# Patient Record
Sex: Male | Born: 1935 | Race: White | Hispanic: No | Marital: Single | Smoking: Never smoker
Health system: Southern US, Community
[De-identification: ages and names within clinical notes are randomized; demographics above are authoritative.]

## PROBLEM LIST (undated history)

## (undated) DIAGNOSIS — N400 Enlarged prostate without lower urinary tract symptoms: Secondary | ICD-10-CM

## (undated) DIAGNOSIS — K219 Gastro-esophageal reflux disease without esophagitis: Secondary | ICD-10-CM

## (undated) DIAGNOSIS — E559 Vitamin D deficiency, unspecified: Secondary | ICD-10-CM

## (undated) DIAGNOSIS — R7989 Other specified abnormal findings of blood chemistry: Secondary | ICD-10-CM

## (undated) DIAGNOSIS — K635 Polyp of colon: Secondary | ICD-10-CM

## (undated) DIAGNOSIS — M1990 Unspecified osteoarthritis, unspecified site: Secondary | ICD-10-CM

## (undated) DIAGNOSIS — H269 Unspecified cataract: Secondary | ICD-10-CM

## (undated) DIAGNOSIS — E785 Hyperlipidemia, unspecified: Secondary | ICD-10-CM

## (undated) HISTORY — PX: CHOLECYSTECTOMY: SHX55

## (undated) HISTORY — PX: APPENDECTOMY: SHX54

## (undated) HISTORY — DX: Unspecified cataract: H26.9

## (undated) HISTORY — PX: SPINE SURGERY: SHX786

## (undated) HISTORY — DX: Vitamin D deficiency, unspecified: E55.9

## (undated) HISTORY — DX: Other specified abnormal findings of blood chemistry: R79.89

## (undated) HISTORY — DX: Hyperlipidemia, unspecified: E78.5

## (undated) HISTORY — PX: TARSAL TUNNEL RELEASE: SHX5042

## (undated) HISTORY — DX: Benign prostatic hyperplasia without lower urinary tract symptoms: N40.0

## (undated) HISTORY — DX: Gastro-esophageal reflux disease without esophagitis: K21.9

## (undated) HISTORY — DX: Unspecified osteoarthritis, unspecified site: M19.90

## (undated) HISTORY — DX: Polyp of colon: K63.5

---

## 2000-02-06 ENCOUNTER — Encounter: Payer: Self-pay | Admitting: Family Medicine

## 2000-02-06 ENCOUNTER — Ambulatory Visit (HOSPITAL_COMMUNITY): Admission: RE | Admit: 2000-02-06 | Discharge: 2000-02-06 | Payer: Self-pay | Admitting: Family Medicine

## 2000-02-22 ENCOUNTER — Encounter (INDEPENDENT_AMBULATORY_CARE_PROVIDER_SITE_OTHER): Payer: Self-pay

## 2000-02-22 ENCOUNTER — Ambulatory Visit (HOSPITAL_COMMUNITY): Admission: RE | Admit: 2000-02-22 | Discharge: 2000-02-22 | Payer: Self-pay | Admitting: Gastroenterology

## 2001-07-25 ENCOUNTER — Ambulatory Visit (HOSPITAL_COMMUNITY): Admission: RE | Admit: 2001-07-25 | Discharge: 2001-07-25 | Payer: Self-pay | Admitting: Gastroenterology

## 2001-07-25 ENCOUNTER — Encounter (INDEPENDENT_AMBULATORY_CARE_PROVIDER_SITE_OTHER): Payer: Self-pay | Admitting: Specialist

## 2003-05-27 ENCOUNTER — Encounter: Admission: RE | Admit: 2003-05-27 | Discharge: 2003-06-22 | Payer: Self-pay | Admitting: Neurosurgery

## 2003-11-29 ENCOUNTER — Ambulatory Visit (HOSPITAL_COMMUNITY): Admission: RE | Admit: 2003-11-29 | Discharge: 2003-11-29 | Payer: Self-pay | Admitting: Family Medicine

## 2003-12-01 ENCOUNTER — Inpatient Hospital Stay (HOSPITAL_BASED_OUTPATIENT_CLINIC_OR_DEPARTMENT_OTHER): Admission: RE | Admit: 2003-12-01 | Discharge: 2003-12-01 | Payer: Self-pay | Admitting: *Deleted

## 2004-01-01 ENCOUNTER — Ambulatory Visit (HOSPITAL_COMMUNITY): Admission: RE | Admit: 2004-01-01 | Discharge: 2004-01-02 | Payer: Self-pay | Admitting: Orthopedic Surgery

## 2004-01-06 ENCOUNTER — Inpatient Hospital Stay (HOSPITAL_COMMUNITY): Admission: RE | Admit: 2004-01-06 | Discharge: 2004-01-10 | Payer: Self-pay | Admitting: Psychiatry

## 2004-01-06 ENCOUNTER — Encounter: Payer: Self-pay | Admitting: Emergency Medicine

## 2004-07-31 ENCOUNTER — Ambulatory Visit: Payer: Self-pay | Admitting: Cardiology

## 2004-07-31 ENCOUNTER — Inpatient Hospital Stay (HOSPITAL_COMMUNITY): Admission: EM | Admit: 2004-07-31 | Discharge: 2004-08-01 | Payer: Self-pay | Admitting: Emergency Medicine

## 2004-08-04 ENCOUNTER — Ambulatory Visit: Payer: Self-pay

## 2004-08-10 ENCOUNTER — Ambulatory Visit: Payer: Self-pay | Admitting: *Deleted

## 2005-08-30 IMAGING — CR DG CHEST 2V
2 series · 2 of 2 positions shown · non-contrast
Comparison: None.

CLINICAL DATA: Bilateral tarsal tunnel syndrome; preop respiratory evaluation. 
 CHEST (TWO VIEWS) 12/31/03

[view not recorded (1 of 2)]
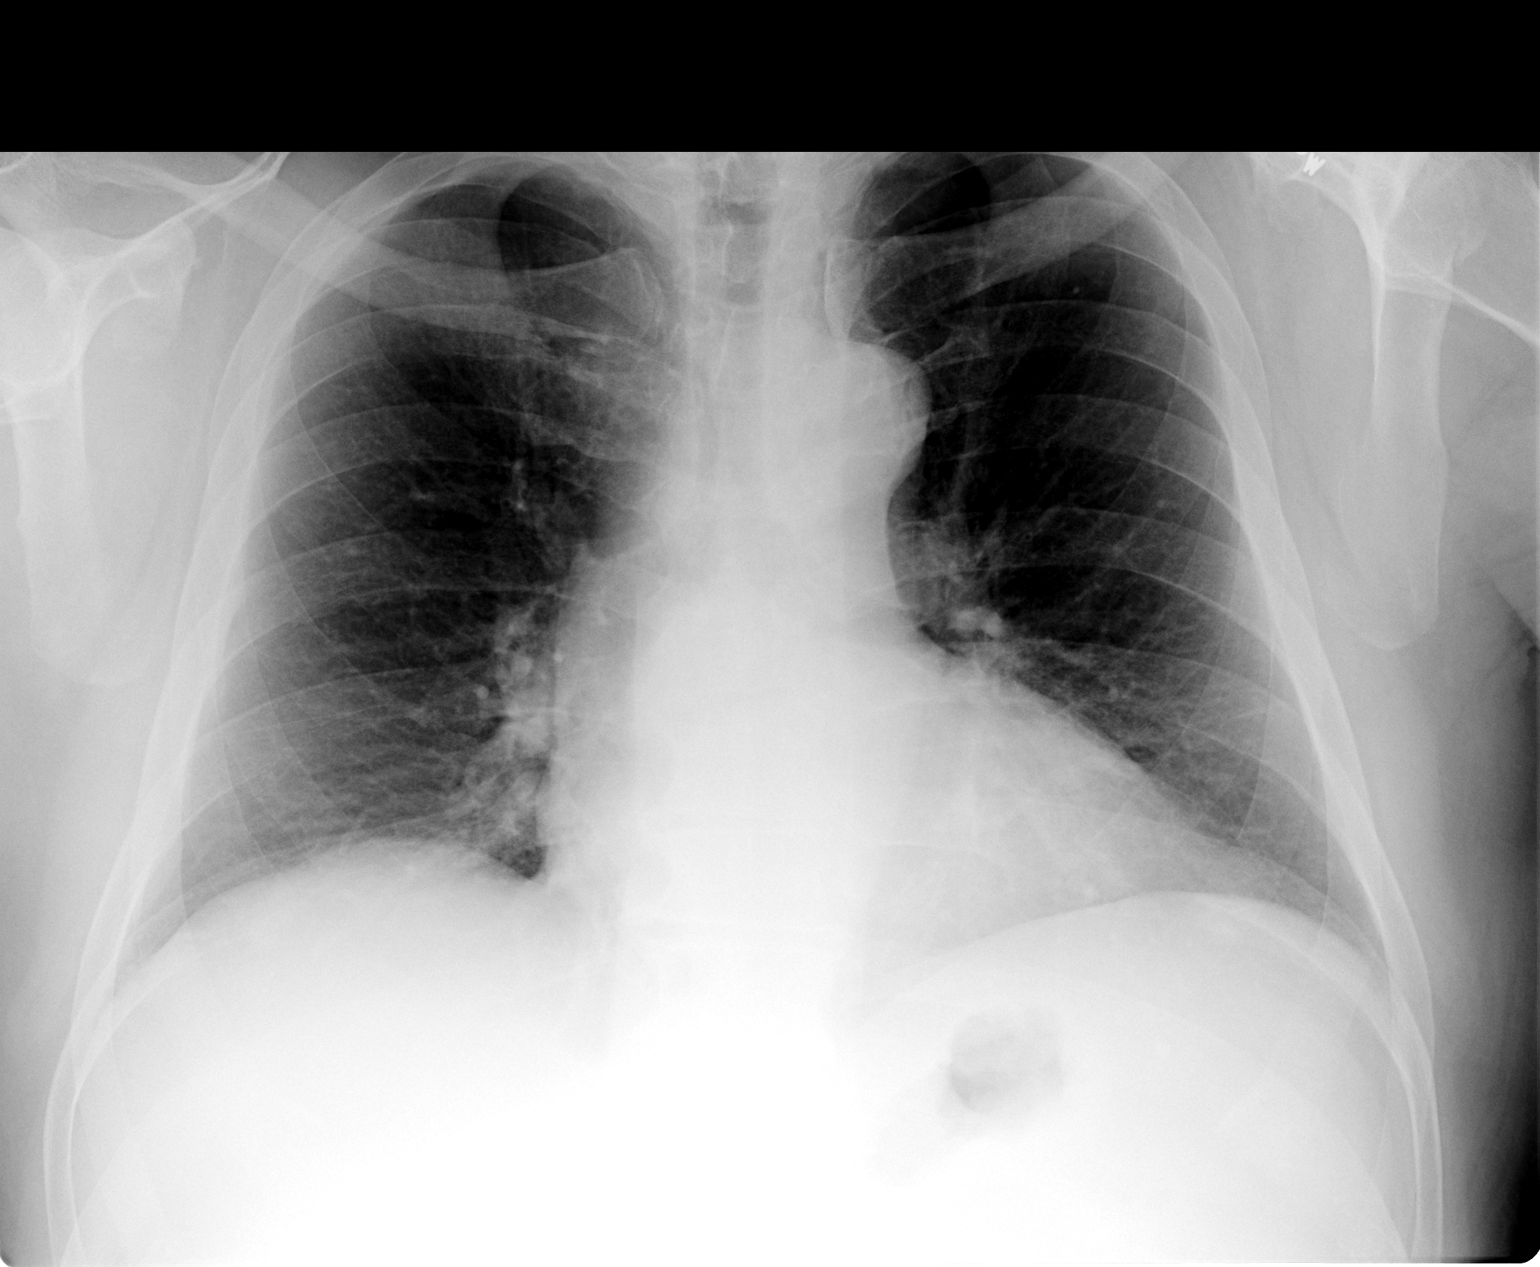

[view not recorded (2 of 2)]
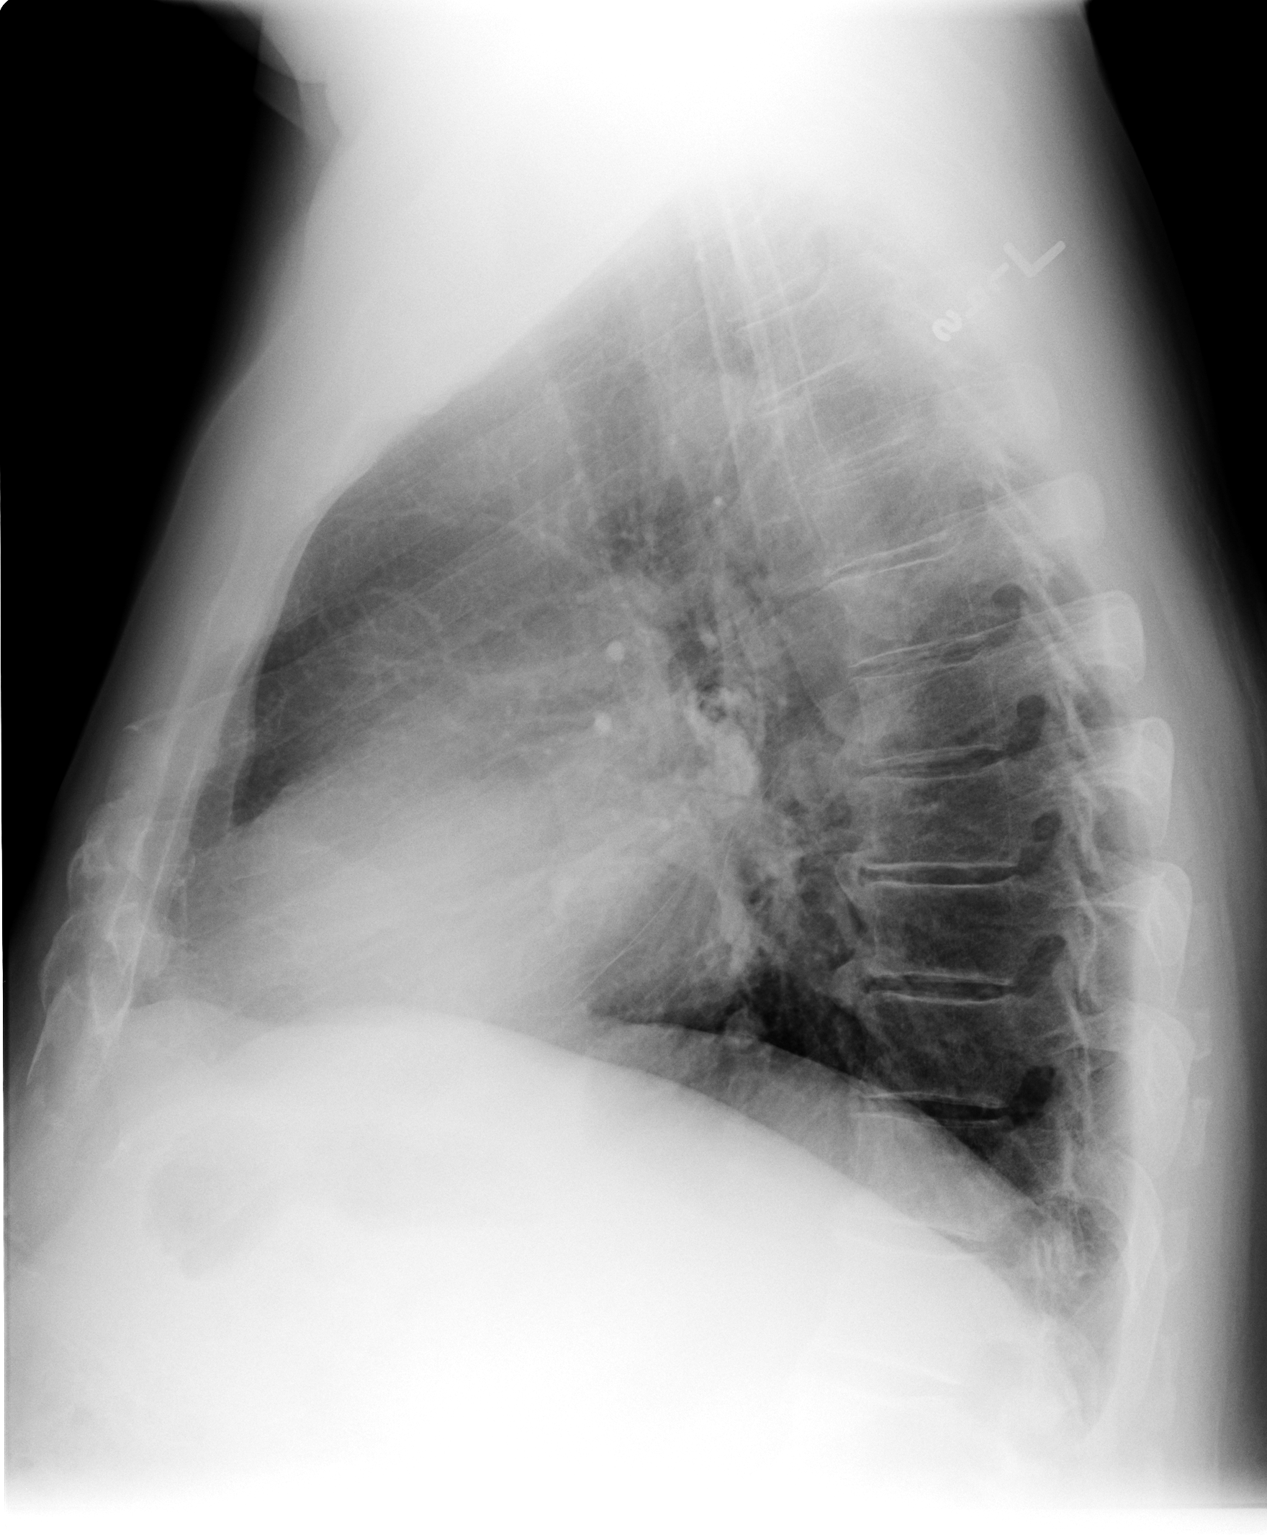

[2 of 2 positions shown; findings below may reference images not displayed]

FINDINGS: The heart size is upper normal.  The thoracic aorta is mildly tortuous.  The hilar and mediastinal contours are otherwise unremarkable.  The lungs appear clear.  Degenerative changes are present throughout the thoracic spine. 
 IMPRESSION
 No evidence of acute disease.

## 2008-10-22 ENCOUNTER — Ambulatory Visit: Payer: Self-pay | Admitting: Cardiology

## 2008-10-22 DIAGNOSIS — E785 Hyperlipidemia, unspecified: Secondary | ICD-10-CM | POA: Insufficient documentation

## 2008-10-22 DIAGNOSIS — I10 Essential (primary) hypertension: Secondary | ICD-10-CM | POA: Insufficient documentation

## 2008-10-22 DIAGNOSIS — E663 Overweight: Secondary | ICD-10-CM | POA: Insufficient documentation

## 2008-10-22 DIAGNOSIS — I2510 Atherosclerotic heart disease of native coronary artery without angina pectoris: Secondary | ICD-10-CM | POA: Insufficient documentation

## 2008-10-22 DIAGNOSIS — E119 Type 2 diabetes mellitus without complications: Secondary | ICD-10-CM

## 2008-10-25 ENCOUNTER — Telehealth: Payer: Self-pay | Admitting: Cardiology

## 2008-10-26 ENCOUNTER — Encounter: Payer: Self-pay | Admitting: *Deleted

## 2008-10-26 ENCOUNTER — Telehealth (INDEPENDENT_AMBULATORY_CARE_PROVIDER_SITE_OTHER): Payer: Self-pay | Admitting: *Deleted

## 2008-10-27 ENCOUNTER — Encounter: Payer: Self-pay | Admitting: Cardiology

## 2008-10-27 ENCOUNTER — Ambulatory Visit: Payer: Self-pay

## 2010-07-23 LAB — CONVERTED CEMR LAB
Cholesterol, target level: 200 mg/dL
HDL goal, serum: 40 mg/dL
LDL Goal: 70 mg/dL

## 2010-07-26 ENCOUNTER — Other Ambulatory Visit: Payer: Self-pay | Admitting: Dermatology

## 2010-11-10 NOTE — Procedures (Signed)
[facility]  Patient:    Micheal Figueroa, Micheal Figueroa Visit Number: [id number] MRN: [id number]          Service Type: END Location: ENDO Attending Physician:  Nelda Marseille Dictated by:   Petra Kuba, M.D. Proc. Date: 07/25/01 Admit Date:  07/25/2001   CC:         Monica Becton, M.D.   Procedure Report  PROCEDURE:  Colonoscopy.  INDICATION:  Patient with guaiac-positivity, bright red blood per rectum, change in bowel habits, due for colonic screening.  Consent was signed after risks, benefits, methods, and options thoroughly discussed in the office.  MEDICATIONS:  Demerol 80, Versed 10.  DESCRIPTION OF PROCEDURE:  Rectal inspection was pertinent for external hemorrhoids.  Digital exam was negative.  Video colonoscope was inserted and easily advanced around the colon to the cecum.  This did require some abdominal pressure but no position changes.  The cecum was identified by the appendiceal orifice and the ileocecal valve.  In fact, the scope was inserted a short ways into the terminal ileum which was normal other than some left and right diverticula.  No abnormalities were seen.  The TI was normal.  The scope was slowly withdrawn.  The prep was adequate.   There was some liquid stool that required washing and suctioning.  On slow withdrawal through the colon, in the cecum and two tiny in the ascending were questionable polyps. They were probably just slightly edematous folds, but we went ahead and took two cold biopsies of each and put them in the same container.  As the scope was withdrawn around the transverse, in the approximate level of the splenic flexure, another tiny questionable polyp was seen and was cold biopsied x 2. The scope was slowly withdrawn.  Other than the diverticula mentioned above, no additional findings were seen until we reached the distal sigmoid where a small patch of red, erythematous inflammation was seen and was  probably not polypoid, but again, a few biopsies were obtained and put in a separate container.  Once back in the rectum, the scope was retroflexed, pertinent for some internal hemorrhoids.  The scope was straightened, air was suctioned, and the scope removed.  The patient tolerated the procedure well.  There was no obvious immediate complication.  ENDOSCOPIC DIAGNOSES: 1. Internal and external hemorrhoids. 2. Left and right diverticula. 3. Sigmoid patch of inflammation, status post biopsy and put in container #3. 4. Splenic flexure questionable polyp, biopsied and put in #2. 5. Questionable three small to tiny cecal and ascending polyps, all cold    biopsied and put in container #1. 6. Otherwise within normal limits to the terminal ileum.  PLAN:  Await pathology to determine future colonic screening.  Happy to see back p.r.n.  Otherwise follow up in 2-3 months to recheck symptoms, probably guaiacs and make sure no further work-up plans are needed. Dictated by:   Petra Kuba, M.D. Attending Physician:  Nelda Marseille DD:  07/25/01 TD:  07/25/01 Job: [id number]

## 2010-11-10 NOTE — Cardiovascular Report (Signed)
NAME:  Micheal Figueroa, ADDO                        ACCOUNT NO.:  [id number]   MEDICAL RECORD NO.:  [id number]                   PATIENT TYPE:  OIB   LOCATION:  6501                                 FACILITY:  [facility]   PHYSICIAN:  Carole Binning, M.D. [facility]         DATE OF BIRTH:  07/12/1935   DATE OF PROCEDURE:  12/01/2003  DATE OF DISCHARGE:  12/01/2003                              CARDIAC CATHETERIZATION   PROCEDURE PERFORMED:  Right and left heart catheterization with coronary  angiography and left ventriculography.   INDICATIONS:  Mr. Wiechman is a 75 year old male with multiple cardiac risk  factors.  He has presented with symptoms of progressive exertional dyspnea  occurring with daily activity.  He has also had episodes of somewhat  atypical chest pain.  Because of his multiple risks factors and progressive  symptoms, we opted to proceed with cardiac catheterization to assess his  hemodynamics and rule out coronary artery disease.   PROCEDURAL NOTE:  We initially placed a 4 French sheath in the right femoral  artery.  Coronary angiography was performed with standard Judkins 4 French  catheters.  Left ventriculography was performed with an angled pigtail  catheter.  After completion of the left heart catheterization, we proceeded  with a right heart catheterization.  A 7 French sheath was placed in the  right femoral vein.  Right heart catheterization was performed with a Swan-  Ganz catheter.  Contrast utilized for the procedure was Ominipaque.  There  were no complications.   RESULTS:   HEMODYNAMICS:  1. Right atrial mean pressure 6, right ventricular pressure 35/8, pulmonary     artery pressure 32/12, pulmonary capillary wedge mean pressure 7, left     ventricular pressure 136/12, aortic pressure 136/70.  There was no aortic     valve gradient.  2. Cardiac output by the thermodilution method is 7.7, cardiac index 3.1.     Cardiac output by the Fick method is 5.5, cardiac  index 2.2.  3. Pulmonary arterial oxygen saturation was 63%.  Systemic arterial oxygen     saturation was 92%.   LEFT VENTRICULOGRAM:  Wall motion is normal, ejection fraction estimated at  greater than or equal to 60%.  There is trace mitral regurgitation.   CORONARY ARTERIOGRAPHY (RIGHT-DOMINANT):  1. The left main is normal.  2. Left anterior descending artery has minor luminal irregularities in the     proximal to midvessel.  The LAD gives rise to a single, normal-sized     diagonal branch.  3. Left circumflex has a 50% stenosis in the midvessel just after the origin     of a large first obtuse marginal branch.  The circumflex is otherwise     normal.  The circumflex gives rise to a small ramus intermedius, a large     branching first obtuse marginal, and a normal-sized branching second     obtuse marginal branch.  4. Right coronary artery is a  dominant vessel.  The right coronary artery     has only minor luminal irregularities.  The distal vessel gives rise to a     normal-sized posterior descending artery and two normal-sized     posterolateral branches.   IMPRESSION:  1. Normal right and left heart filling pressures with normal pulmonary     artery pressures.  2. Normal left ventricular systolic function.  3. Moderate but nonobstructive one-vessel coronary artery disease.   In conclusion, the patient's dyspnea does not appear to be cardiac in  etiology.  He does have moderate coronary artery disease, but this does not  appear to be hemodynamically significant.   PLAN:  The patient will be managed medically with aggressive risk factor  modification.                                               Carole Binning, M.D. [facility]    MWP/MEDQ  D:  12/01/2003  T:  12/02/2003  Job:  [id number]   cc:   Ernestina Penna, M.D.  [address] [place]  [place] [address]  Fax: [phone]   Cardiac Catheterization Lab

## 2010-11-10 NOTE — Op Note (Signed)
NAME:  Micheal Figueroa, Micheal Figueroa                        ACCOUNT NO.:  [id number]   MEDICAL RECORD NO.:  [id number]                   PATIENT TYPE:  OIB   LOCATION:  2899                                 FACILITY:  [facility]   PHYSICIAN:  Leonides Grills, M.D.                  DATE OF BIRTH:  1936/04/08   DATE OF PROCEDURE:  12/31/2003  DATE OF DISCHARGE:  01/02/2004                                 OPERATIVE REPORT   PREOPERATIVE DIAGNOSES:  1. Bilateral tarsal tunnel syndrome.  2. Left second through fourth hammer toes.   POSTOPERATIVE DIAGNOSES:  1. Bilateral tarsal tunnel syndrome.  2. Left second through fourth hammer toes.   OPERATION PERFORMED:  1. Bilateral tarsal tunnel release.  2. Left second through fourth toes metatarsophalangeal joint dorsal     capsulotomies with collateral release.  3. Left second through fourth toes proximal head resections.  4. Left second through fourth toes flexor digitorum longus to proximal     phalanx transfers.  5. Left second through fourth toes extensor digitorum brevis to extensor     digitorum longus tendon transfers.   SURGEON:  Leonides Grills, M.D.   ASSISTANT:  Lianne Cure, P.A.   ANESTHESIA:  General endotracheal tube.   ESTIMATED BLOOD LOSS:  Minimal.   TOURNIQUET TIME:  Approximately 20 minutes on the right and an hour and 20  minutes on the left.   COMPLICATIONS:  None.   DISPOSITION:  Stable to PR.   INDICATIONS FOR PROCEDURE:  The patient is a 75 year old male who has had  numbness in both his feet.  EMG nerve conduction studies were obtained and  showed that he had bilateral tarsal tunnel syndrome.  We also obtained an  MRI which did not show space occupying lesion as well.  He also had  complaints of second through fourth hammer toes that were rubbing his shoe  and wanted to have this done at the same time as well.  The patient  has  consented for the above procedure.  All risks which include infection,  neurovascular injury,  persistent pain, persistent numbness, worsening  numbness, recurrence of deformity, ischemia of toe and possible amputation  were all explained, questions were encouraged and answered.   DESCRIPTION OF PROCEDURE:  The patient was brought to the operating room and  placed in supine position after adequate general endotracheal tube  anesthesia was administered as well as Ancef 1 g IV piggyback.  Bilateral  lower extremities were prepped and draped in sterile manner over a  proximally placed thigh tourniquet.  We started with the right side. The  right side was gravity exsanguinated and tourniquet was inflated to 290  mmHg.  A curvilinear incision was made over the posterolateral aspect of the  ankle.  Dissection was carried down through the skin and hemostasis was  obtained.  The flexor retinaculum was then encountered.  This was then  opened up and  nerves were meticulously dissected out into the plantar aspect  after the entire flexor retinaculum was released.  This had excellent  release.  Tourniquet was deflated.  Hemostasis was obtained. Subcutaneous  was closed with 3-0 Vicryl.  Skin was closed with 4-0 nylon.  The left lower  extremity was then gravity exsanguinated and tourniquet was inflated to 290  mmHg. A curvilinear incision was made over the posteromedial aspect of the  left ankle.  Hemostasis was obtained.  Flexor retinaculum was then  identified and traced and released with scissors and careful and meticulous  dissection of the nerves as well.  Once this was adequately released, this  was left opened until the tourniquet was deflated.  A wet Ray-tek was placed  in this area until the tourniquet was deflated  to evaluate for any  bleeding.  A longitudinal incision was made over the second toe.  Dissection  was carried down through skin.  Hemostasis was obtained.  Extensor digitorum  longus and brevis were identified and longus was tenotomized proximal and  medial and the brevis  distal and lateral.  Proximal interphalangeal joint  was then entered and the distal aspect of the proximal phalanx  was then  skeletonized.  The head was then removed with the rongeur.  The  metatarsophalangeal joint was then identified and with a 15 blade scalpel.  A dorsal capsulotomy and collateral release was then performed.  A  longitudinal incision was then made in the plantar plate and the flexor  digitorum longus tendon was then identified. This was tenotomized as distal  as possible and left in the wound for later transfer to the base of the  proximal phalanx.  A 3.5 mm drill was then used with a soft tissue sleeve  and a drill hole was then placed in the base of the proximal phalanx.  The  FDL tendon was then pulled from plantar to dorsal through the drill hole  using 3-0 PDS suture.  A 0.045 double ended trocar K-wire was then placed  antegrade through the middle and distal phalanx and then into the proximal  phalanx retrograde.  With the toe held reduced and the ankle in neutral  dorsiflexion, attention placed on the flexor digitorum longus tendon.  Through the drill hole the K-wire was then advanced across the  metatarsophalangeal joint.  This held the toe in excellent and anatomic  alignment.  The extensor digitorum brevis was then transferred to the  extensor digitorum longus tendon dorsally with 3-0 PDS suture tying this to  the stump of the FDL tendon dorsally as well.  The same exact procedure was  performed on the third and fourth toes respectively without complication.  Tourniquet was deflated.  There was no pulsatile bleeding.  Hemostasis was  obtained.  Over the posteromedial aspect of the left ankle, the subcutaneous  was closed with 3-0 Vicryl, the skin was closed with 4-0 nylon.  Over the  toes all wounds were closed with 4-0 nylon.  Skin relieving incisions were made with an 11 blade scalpel on either side of the K-wire.  The K-wires  were bent, cut and capped.   All toes pinked up nicely with excellent  capillary refill.  Sterile dressings were applied on both feet.  Cam walker  boots were applied.  The patient was stable to the PR.  Leonides Grills, M.D.    PB/MEDQ  D:  01/01/2004  T:  01/03/2004  Job:  [id number]

## 2010-11-10 NOTE — Discharge Summary (Signed)
NAME:  Micheal Figueroa, POTVIN                        ACCOUNT NO.:  [id number]   MEDICAL RECORD NO.:  [id number]                   PATIENT TYPE:  IPS   LOCATION:  0400                                 FACILITY:  [facility]   PHYSICIAN:  Geoffery Lyons, M.D.                   DATE OF BIRTH:  Jul 26, 1935   DATE OF ADMISSION:  01/06/2004  DATE OF DISCHARGE:  01/10/2004                                 DISCHARGE SUMMARY   CHIEF COMPLAINT AND PRESENT ILLNESS:  This was the first admission to [facility] for this 75 year old married white male  voluntarily admitted.  He had history of psychosis, positive for auditory  and visual hallucinations, seeing bugs, spiders.  He had not slept for four  days.  He had a recent bilateral tarsal surgery, on Mepergan Fortis.  He  began to hallucinate.  He stopped the medication, got worse.   PAST PSYCHIATRIC HISTORY:  This was the first time at [facility]; no other psychiatric admissions.   SUBSTANCE ABUSE HISTORY:  He denied regular use of alcohol.   PAST MEDICAL HISTORY:  Foot surgery, bilateral tarsal surgery.   MEDICATIONS:  1. Cardizem 180 mg daily.  2. Lipitor 40 mg daily.   LABORATORY DATA:  CBC: Hemoglobin 11.7.  Blood chemistries: Potassium 3.4.  Liver profile was within normal limits.   MENTAL STATUS EXAM:  Mental status exam revealed an alert male, confused  with moments of clarity.  Speech: Rambling.  Affect: Labile.  Thought  processes: Actively hallucinating, positive auditory and visual  hallucinations, confused.   ADMISSION DIAGNOSES:   AXIS I:  1. Psychotic disorder, not otherwise specified.  2. Substance-induced psychotic symptoms.   AXIS II:  No diagnosis.   AXIS III:  1. Arterial hypertension.  2. Hypercholesterolemia.   AXIS IV:  Moderate.   AXIS V:  Global assessment of functioning upon admission 25, highest global  assessment of functioning in the last year 75-80.   HOSPITAL COURSE:  He was  transferred to [facility] after  bilateral foot surgery.  He was unsteady.  During the admission process he  saw fireflies and a kitten in the assessment room but was mostly lucid.  He  continued to evidence confusions with hallucinations, unsteady gait.  He was  given some Haldol and some Ativan.  He was also given Geodon 20 mg x one  dose.  He was placed on one-on-one for safety.  He was seen by internal  medicine.  But by July 15, the delirium had resolved.  He was very  appropriate.  When he was clear, he denied that he was using alcohol or  benzodiazepines.  He family confirmed that there was no history of alcohol  abuse so the most plausible explanation for his presentation was a reaction  to medication.  On July 18, he was in full contact with reality, no  hallucinations, no delusions, responded well to the antipsychotic.  He  denied the use of alcohol or benzodiazepines, possibly an anticholinergic  delirium.   DISCHARGE DIAGNOSES:   AXIS I:  Medication-induced organic brain syndrome.   AXIS II:  No diagnosis.   AXIS III:  1. Arterial hypertension.  2. Hypercholesterolemia.   AXIS IV:  Moderate.   AXIS V:  Global assessment of functioning upon discharge 55-60.   DISCHARGE MEDICATIONS:  1. Cardizem CD 180 mg daily.  2. Haldol 0.5 mg one twice a day and two at night.  3. Hytrin 2 mg daily.  4. Actos 30 mg daily.  5. Prinivil 40 mg daily.  6. Diovan HCT 160/12.5 mg one daily.  7. Lipitor 40 mg daily.  8. Librium 10 mg one twice a day.   FOLLOW UP:  He was to follow up with [facility] with Dr.  Jacqulyn Bath with the plans to eventually get him off the Haldol and the Librium.                                               Geoffery Lyons, M.D.    IL/MEDQ  D:  02/01/2004  T:  02/02/2004  Job:  [id number]

## 2010-11-10 NOTE — H&P (Signed)
Micheal Figueroa, Micheal Figueroa              ACCOUNT NO.:  [id number]   MEDICAL RECORD NO.:  [id number]          PATIENT TYPE:  EMS   LOCATION:  MAJO                         FACILITY:  [facility]   PHYSICIAN:  Thomas C. Wall, M.D.   DATE OF BIRTH:  Nov 14, 1935   DATE OF ADMISSION:  07/31/2004  DATE OF DISCHARGE:                                HISTORY & PHYSICAL   SUMMARY OF HISTORY:  Micheal Figueroa is a 75 year old white male who was  referred to [facility] emergency room from his primary care physician  secondary to chest discomfort and hypertension.  Micheal Figueroa stated that  this past fall Micheal Figueroa stopped multiple medications because Micheal Figueroa had been taking  them for a long, long time, and they felt that they were draining his  energy.  When Micheal Figueroa stopped these medications, Micheal Figueroa did not discuss this with  anybody, and Micheal Figueroa started taking various herbal medications.  Micheal Figueroa states that  this past week Micheal Figueroa has had an upper abdominal/lower chest heaviness that has  not resolved.  Wednesday evening, after waking up and using the bathroom and  laying back down, Micheal Figueroa suddenly developed a choking sensation in her throat  where Micheal Figueroa could not breathe.  This resolved in less than 5 minutes.  Friday  afternoon, while eating dinner consisting of spaghetti and meatballs, Micheal Figueroa  felt a full sensation that was worse than the constant heavy feeling in her  upper abdomen and chest, and Micheal Figueroa did not feel well.  Micheal Figueroa did not have any  associated nausea, vomiting, diaphoresis or shortness of breath.  Micheal Figueroa went to  bed around 10 o'clock, but woke up several times with a continued heavy  feeling, with occasional shooting pain down his left arm, associated with  shortness of breath.  Micheal Figueroa gave these feeling a scale of 4-5 on a scale of 0-  10.  Micheal Figueroa did not have any increased gas or water brash, and his bowel  movements have not changed.  Saturday, the feelings persisted.  On Sunday,  Micheal Figueroa actually woke up with a headache, and the feelings Micheal Figueroa thought were  slightly worse Sunday evening.  Throughout this period of time since  Wednesday, Micheal Figueroa has checked his blood pressure somewhat more frequently than  usual, and Micheal Figueroa felt that it was elevated.  Thus, Micheal Figueroa called his physician this  morning to arrange a follow up appointment.  It is noted in the last week  his blood pressures range from the 150s to 210s over 70 to 100s.   PAST MEDICAL HISTORY:   ALLERGIES:  No known drug allergies.   CURRENT MEDICATIONS:  1.  Aspirin 325 daily.  2.  Accupril 40 daily.  3.  Red yeast rice coenzyme q10.  4.  Multivitamin B-12.  5.  Fish oil.  6.  Glucosamine.  7.  Chondroitin.  8.  Calcium.  9.  Magnesium supplements.  It is noted that the medications that Micheal Figueroa stopped include -  1.  Lipitor 40 mg q.h.s.  2.  Diovan 160/12.5 daily.  3.  Cardizem CD 180 mg daily.  4.  Hytrin 2 mg  daily.  5.  Nexium 40 mg daily.  6.  Actos 15 mg daily.   PAST HISTORY:  1.  Hypertension.  2.  Hyperlipidemia.  3.  BPH.  4.  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  5.  Osteoarthritis.  6.  Dyspnea on exertion with atypical chest pain, for which Micheal Figueroa underwent a      cardiac catheterization on December 01, 2003.  Ejection fraction showed 60%      with trace MR, and a 50% mid circumflex with some irregularities in the      LAD.  7.  History of glucose intolerance/metabolic syndrome.  8.  Status post colonoscopy in August 2001 and January 2003, which have      shown internal and external hemorrhoids, moderate diverticula, and      polyps.   SURGICAL HISTORY:  1.  Notable for a cholecystectomy,  2.  Appendectomy.  3.  Tarsal tunnel surgery bilaterally in July of 2005.  4.  T&A.   SOCIAL HISTORY:  Micheal Figueroa resides in [place] with his wife.  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  Micheal Figueroa has 2 daughters, 2  grandchildren, and no great-grandchildren.  Micheal Figueroa has never  smoked.  Micheal Figueroa drinks  two scotches per night.  Micheal Figueroa denies any drug use.  Micheal Figueroa is taking herbal  medications, as previously listed.  Micheal Figueroa states Micheal Figueroa maintains a no-salt, low-  fat diet.  Micheal Figueroa walks one mile 5 times a week without difficulty.   FAMILY HISTORY:  Notable for the death of his mother at the age of 41 with  TIAs, questionable history of diabetes.  Father in his 17s with a history of  prostate cancer.  Micheal Figueroa has a brother and sister who are both living.  His  brother has been treated for renal cancer.   REVIEW OF SYSTEMS:  Notable for at least a 15 pound weight gain over the  last year, headaches, glasses, symptoms as previously described, nocturia,  polyuria, arthralgia in the knees and feet, and occasional diarrhea and  abdominal discomfort.   PHYSICAL EXAMINATION:  GENERAL:  Well-nourished, well-developed, pleasant  white male in no apparent distress.  VITAL SIGNS:  Temperature is 98.4, blood pressure 187/89, heart rate 80, 97%  on room air.  HEENT:  Unremarkable.  NECK:  Supple without thyromegaly, adenopathy, JVD, or carotid bruits.  CHEST:  Symmetrical excursion, diminished breath sounds, but clear to  auscultation.  HEART:  Distant heart sounds.  Slightly irregular rhythm with a normal S1  and S2.  Murmurs, rubs, clicks, or gallops are not appreciated.  ABDOMEN:  Obese.  Bowel sounds present.  Overall muscular appears tense.  It  is difficult to appreciate any organomegaly.  EXTREMITIES:  Negative clubbing, cyanosis, or edema.  Peripheral pulses are  symmetrical and intact.  MUSCULOSKELETAL:  Unremarkable.  NEUROLOGIC:  Unremarkable.   CHEST X-RAY:  Pending.   EKG:  An EKG from Dr. Kathi Der office shows normal sinus rhythm, normal axis,  PVC's, left ventricular hypertrophy with probable repolarization changes,  incomplete right bundle branch block.  EKG in the emergency room is the same, but this also shows some T wave inversion in III and aVF, which, after  review, Dr. Daleen Squibb  feels is new compared to his old EKGs.   LABORATORY DATA:  Hemoglobin and hematocrit of 17.7 and 52.  Sodium 138,  potassium  4.5, BUN 14, creatinine 1.1, glucose 121.   IMPRESSION:  1.  Chest discomfort with new T wave inversion in III and aVF.  Questionable      ischemia.  However, symptoms seem most compatible with gastroesophageal      reflux disease/esophagitis.  2.  History of nonobstructive coronary artery disease.  Need to rule out      progressive coronary artery disease with __________  dysfunction,      especially with noncompliance with medications.  3.  Hypertension.  4.  Hyperlipidemia.  5.  Diabetes.   DISPOSITION:  We will admit the patient.  We will continue his Accupril,  restart HCTZ, Nexium, aspirin, Actos, and Lipitor for his medical __________  .  We will rule out myocardial infarction by serial enzymes.  If his enzymes  are negative, we will have him back in the office to undergo a stress  Myoview, and follow up with Dr. Gerri Spore.  While Micheal Figueroa is here in the  hospital, we will also check his lipids, hemoglobin A1C, TSH, and an amylase  and lipase.      EW/MEDQ  D:  07/31/2004  T:  07/31/2004  Job:  [id number]   cc:   Ernestina Penna, M.D.  [address] [place]  [place] [address]  Fax: [phone]   Carole Binning, M.D. [facility]

## 2010-11-10 NOTE — Discharge Summary (Signed)
[name], Micheal Figueroa              ACCOUNT NO.:  [id number]   MEDICAL RECORD NO.:  [id number]          PATIENT TYPE:  INP   LOCATION:  2033                         FACILITY:  [facility]   PHYSICIAN:  Jesse Sans. Wall, M.D.   DATE OF BIRTH:  05/07/1936   DATE OF ADMISSION:  07/31/2004  DATE OF DISCHARGE:  08/01/2004                                 DISCHARGE SUMMARY   PROCEDURES:  None.   HOSPITAL COURSE:  Micheal Figueroa is a 75 year old male with a history of  coronary artery disease.  He had nonobstructive coronary artery disease by  catheterization in June of 2005 with an EF of 60%.  He has a history of  hypertension, hyperlipidemia.  Prior to admission he had some episodes of  choking sensation that was associated with shortness of breath and resolved  at about five minutes.  He also had a full feeling in his abdomen and chest  and heaviness in his chest as well.  He came to the emergency room, was  admitted for further evaluation and treatment.   His cardiac enzymes were negative for MI.  He was evaluated by Dr. Daleen Squibb and  by Dr. Gerri Spore who felt that outpatient evaluation was indicated and he  has an outpatient Cardiolite scheduled.  Additionally, he was hypertensive  on admission with a blood pressure of 169/103.  He had stopped several of  his medications because of a feeling of fatigue and general malaise.  He was  restarted on most of his medications with the exception of Hytrin and  Diovan/HCT.  These medications are on hold.  He is to follow up in the  office for a blood pressure check and to see how he is doing as far as the  general malaise goes.  He stated that once he stopped all of his medications  the general malaise went away.  Additionally, a lipid profile was performed  and he is being restarted on his Statin.  A lipid profile is pending at time  of dictation.   By August 01, 2004, Micheal Figueroa was ambulating without chest pain or  shortness of breath.  He was  considered stable for discharge with outpatient  follow-up arranged.   DISCHARGE DIAGNOSES:  1.  Chest pain, history of non-obstructive coronary artery disease by      catheterization in 2005, Cardiolite on February 9.  2.  Hypertension.  3.  Hyperlipidemia.  4.  Diabetes.  5.  Psychosis with focal and auditory hallucinations.  6.  History of osteoarthritis.  7.  Status post cholecystectomy, appendectomy, tonsillectomy.  8.  History of diverticulosis and some polyps by colonoscopy.   DISCHARGE INSTRUCTIONS:  He is to have no strenuous activity until after the  stress test.  He is to stick to a diet that is low in fat and salt.  He is  to get the stress test on February 9 at 1 p.m.  He is to follow up with Dr.  Clarita Crane P.A. on February 16 at 11 a.m. and with Dr. Christell Constant as scheduled.   DISCHARGE MEDICATIONS:  1.  Nexium 40 mg  daily.  2.  __________ 40 mg daily.  3.  Cardizem CD 180 mg daily.  4.  Accupril 40 mg daily.  5.  Actos 15 mg daily.  6.  Aspirin 325 mg daily.  7.  He is not to take Diovan/HCT or Hytrin for now.      RB/MEDQ  D:  08/01/2004  T:  08/01/2004  Job:  [id number]   cc:   Carole Binning, M.D. [facility], M.D.

## 2010-11-10 NOTE — Procedures (Signed)
[facility]  Patient:    Micheal Figueroa, Micheal Figueroa                     MRN: [id number] Proc. Date: 02/22/00 Adm. Date:  [id number] Attending:  Nelda Marseille CC:         Monica Becton, M.D.   Procedure Report  PROCEDURE:  Colonoscopy with polypectomy.  INDICATIONS FOR PROCEDURE:  Guaiac positivity, change in bowel habits in a patient due for colonic screening.  Consent was signed after risks, benefits, methods, and options were thoroughly discussed in the office.  MEDICINES USED:  Demerol 50, versed 5.  DESCRIPTION OF PROCEDURE:  Rectal inspection was pertinent for external hemorrhoids. Digital exam was negative. The video colonoscope was inserted and easily advanced around the colon to the level of the ileocecal valve. On insertion both left and right diverticula were seen and also small and a large sigmoid polyp was seen on insertion. No other obvious abnormality was seen on insertion. To advance to the cecum did require some abdominal pressure but no position changes. The cecum was identified by the appendiceal orifice and the ileocecal valve. In the cecum, a questionable tiny 1-2 mm polyp was seen and was cold biopsied x 2. The scope was further withdrawn. The ascending had some right sided diverticula. In the mid transverse, a 4 mm polyp was seen and hot biopsied x 2 and put in the second container. The scope was further withdrawn. There was some diverticula on the left side but no other polypoid lesions were seen until we withdrew back to the large polyp in the mid sigmoid which had a very long wide stalk. We elected to place the endoloop which was done in the customary fashion and tried to get it as low on the stalk as possible and this was fastened snugly in the customary fashion. We then went ahead and snared the polyp. Electrocautery was applied.  The polyp was removed. There was no bleeding seen. Part of the polyp that was removed and  was suctioned onto the head of the scope and the scope was removed and the polyp recovered. The scope was reinserted into the rectum and retroflexed pertinent for some small internal hemorrhoids. The scope was then straightened and advanced a short ways up the sigmoid where the distal small sigmoid polyp was seen, snared, electrocautery applied and this was suctioned in the trap after removal and put in the 4th container. There seemed to be a slight amount of possible residual adenomatous tissue which was hot biopsied x 1. The scope was then reinserted to the stalk in the endoloop. Possibly there was some residual adenomatous tissue on the end of the stalk which was snared, electrocautery applied and this piece was suctioned through the scope after removal and put back in the third container with the other piece of this large polyp. No bleeding was seen. The scope was then rewithdrawn back to the rectum. No additional findings were seen. Air was suction, the scope removed. The patient tolerated the procedure well and there was no obvious or immediate complications. There was some liquid stool that required washing and suctioning. The scope was slowly withdrawn. The cecum and ascending were normal except for the diverticula mentioned above. In the mid sigmoid, a 4 mm polyp was seen and was hot biopsied x 2 and put in the first container.  ENDOSCOPIC DIAGNOSIS: 1. Internal/external hemorrhoids, small. 2. Left and right moderate diverticula. 3. Mid sigmoid large polyp  on a long wide stalk status post snare x 2 after    the endoloop being placed without any obvious bleeding. 4. Three small polyps seen, 1 in the sigmoid snared and hot biopsied, 1 in    the transverse hot biopsied and 1 in the cecum cold biopsied. 5. Otherwise within normal limits to the cecum.  PLAN:  Await pathology to determine future colonic screening and help confirm that all of the polyp on the long stalk was removed.  Follow-up in 6 weeks to recheck guaiac symptoms and make sure no further workup plans are needed. In the meantime, 2 weeks customary post polypectomy instructions and call me sooner p.r.n.DD:  02/22/00 TD:  02/23/00 Job: [id number]

## 2012-10-01 ENCOUNTER — Ambulatory Visit (INDEPENDENT_AMBULATORY_CARE_PROVIDER_SITE_OTHER): Payer: Medicare Other | Admitting: Family Medicine

## 2012-10-01 ENCOUNTER — Telehealth: Payer: Self-pay | Admitting: Family Medicine

## 2012-10-01 ENCOUNTER — Encounter: Payer: Self-pay | Admitting: Family Medicine

## 2012-10-01 VITALS — BP 113/59 | HR 62 | Temp 97.9°F | Ht 74.0 in | Wt 248.2 lb

## 2012-10-01 DIAGNOSIS — W57XXXA Bitten or stung by nonvenomous insect and other nonvenomous arthropods, initial encounter: Secondary | ICD-10-CM

## 2012-10-01 MED ORDER — DOXYCYCLINE HYCLATE 100 MG PO TABS: 100.0000 mg | ORAL_TABLET | Freq: Two times a day (BID) | ORAL | Status: DC

## 2012-10-01 NOTE — Patient Instructions (Addendum)
Take medication as directed Benadryl as needed for itching Deer Tick Bite Deer ticks are brown arachnids (spider family) that vary in size from as small as the head of a pin to 1/4 inch (1/2 cm) diameter. They thrive in wooded areas. Deer are the preferred host of adult deer ticks. Small rodents are the host of young ticks (nymphs). When a person walks in a field or wooded area, young and adult ticks in the surrounding grass and vegetation can attach themselves to the skin. They can suck blood for hours to days if unnoticed. Ticks are found all over the [place] Some ticks carry a specific bacteria (Borrelia burgdorferi) that causes an infection called Lyme disease. The bacteria is typically passed into a person during the blood sucking process. This happens after the tick has been attached for at least a number of hours. While ticks can be found all over the [place] those carrying the bacteria that causes Lyme disease are most common in [place] and the [place]. Only a small proportion of ticks in these areas carry the Lyme disease bacteria and cause human infections. Ticks usually attach to warm spots on the body, such as the:  Head.  Back.  Neck.  Armpits.  Groin. SYMPTOMS  Most of the time, a deer tick bite will not be felt. You may or may not see the attached tick. You may notice mild irritation or redness around the bite site. If the deer tick passes the Lyme disease bacteria to a person, a round, red rash may be noticed 2 to 3 days after the bite. The rash may be clear in the middle, like a bull's-eye or target. If not treated, other symptoms may develop several days to weeks after the onset of the rash. These symptoms may include:  New rash lesions.  Fatigue and weakness.  General ill feeling and achiness.  Chills.  Headache and neck pain.  Swollen lymph glands.  Sore muscles and joints. 5 to 15% of untreated people with Lyme disease may develop more severe illnesses after  several weeks to months. This may include inflammation of the brain lining (meningitis), nerve palsies, an abnormal heartbeat, or severe muscle and joint pain and inflammation (myositis or arthritis). DIAGNOSIS   Physical exam and medical history.  Viewing the tick if it was saved for confirmation.  Blood tests (to check or confirm the presence of Lyme disease). TREATMENT  Most ticks do not carry disease. If found, an attached tick should be removed using tweezers. Tweezers should be placed under the body of the tick so it is removed by its attachment parts (pincers). If there are signs or symptoms of being sick, or Lyme disease is confirmed, medicines (antibiotics) that kill germs are usually prescribed. In more severe cases, antibiotics may be given through an intravenous (IV) access. HOME CARE INSTRUCTIONS   Always remove ticks with tweezers. Do not use petroleum jelly or other methods to kill or remove the tick. Slide the tweezers under the body and pull out as much as you can. If you are not sure what it is, save it in a jar and show your caregiver.  Once you remove the tick, the skin will heal on its own. Wash your hands and the affected area with water and soap. You may place a bandage on the affected area.  Take medicine as directed. You may be advised to take a full course of antibiotics.  Follow up with your caregiver as recommended. FINDING OUT THE RESULTS  OF YOUR TEST Not all test results are available during your visit. If your test results are not back during the visit, make an appointment with your caregiver to find out the results. Do not assume everything is normal if you have not heard from your caregiver or the medical facility. It is important for you to follow up on all of your test results. PROGNOSIS  If Lyme disease is confirmed, early treatment with antibiotics is very effective. Following preventive guidelines is important since it is possible to get the disease more  than once. PREVENTION   Wear long sleeves and long pants in wooded or grassy areas. Tuck your pants into your socks.  Use an insect repellent while hiking.  Check yourself, your children, and your pets regularly for ticks after playing outside.  Clear piles of leaves or brush from your yard. Ticks might live there. SEEK MEDICAL CARE IF:   You or your child has an oral temperature above 102 F (38.9 C).  You develop a severe headache following the bite.  You feel generally ill.  You notice a rash.  You are having trouble removing the tick.  The bite area has red skin or yellow drainage. SEEK IMMEDIATE MEDICAL CARE IF:   Your face is weak and droopy or you have other neurological symptoms.  You have severe joint pain or weakness. MAKE SURE YOU:   Understand these instructions.  Will watch your condition.  Will get help right away if you are not doing well or get worse. FOR MORE INFORMATION Centers for Disease Control and Prevention: FootballExhibition.com.br American Academy of [employer]icians: www.https://powers.com/ Document Released: 09/05/2009 Document Revised: 09/03/2011 Document Reviewed: 09/05/2009 [facility] Patient Information 2013 [place], [place].

## 2012-10-01 NOTE — Progress Notes (Signed)
  Subjective:    Patient ID: Micheal Figueroa, male    DOB: 28-Sep-1935, 77 y.o.   MRN: [id number]  HPI Dear tick removed from manubrium one day ago after 48 hours of attachment.  Review of Systems  Constitutional: Negative for fever.  Musculoskeletal: Negative for myalgias and arthralgias.  Skin: Positive for wound (tick bite).  Neurological: Negative for headaches.       Objective:   Physical Exam Erythema over manubrium       Assessment & Plan:  1. Tick bite - doxycycline (VIBRA-TABS) 100 MG tablet; Take 1 tablet (100 mg total) by mouth 2 (two) times daily.  Dispense: 28 tablet; Refill: 0

## 2012-10-01 NOTE — Telephone Encounter (Signed)
APPT MADE

## 2012-10-07 ENCOUNTER — Telehealth: Payer: Self-pay | Admitting: Family Medicine

## 2012-10-08 NOTE — Telephone Encounter (Signed)
Pt notified that copy of RX to front

## 2012-10-21 ENCOUNTER — Telehealth: Payer: Self-pay | Admitting: Family Medicine

## 2012-10-22 NOTE — Telephone Encounter (Signed)
LM WILL CALL WHEN RX READY. CALLED PT AGAIN AND LM TO CALL. NEED TO KNOW IF PT GETTING CHEWABLE TABLETS

## 2012-10-23 ENCOUNTER — Other Ambulatory Visit: Payer: Self-pay | Admitting: Family Medicine

## 2012-10-23 MED ORDER — FLORANEX PO TABS: ORAL_TABLET | ORAL | Status: DC

## 2012-10-23 MED ORDER — FLORANEX PO CHEW: 1.0000 | CHEWABLE_TABLET | Freq: Three times a day (TID) | ORAL | Status: DC

## 2012-10-23 NOTE — Addendum Note (Signed)
Addended by: Bearl Mulberry on: 10/23/2012 05:40 PM   Modules accepted: Orders

## 2012-10-23 NOTE — Telephone Encounter (Signed)
PT NEEDS THIS SENT TO [facility] AND WRITTEN FOR MAIL ORDER FOR 90 WITH REFILLS. CALL PT WHEN READY.

## 2012-10-23 NOTE — Telephone Encounter (Signed)
RX ready for pickup Pt's wife notified 30 day supplied sent to [facility] pharmacy

## 2012-11-03 ENCOUNTER — Other Ambulatory Visit: Payer: Self-pay | Admitting: Dermatology

## 2012-12-17 ENCOUNTER — Telehealth: Payer: Self-pay | Admitting: Family Medicine

## 2012-12-17 ENCOUNTER — Ambulatory Visit (INDEPENDENT_AMBULATORY_CARE_PROVIDER_SITE_OTHER): Payer: Medicare Other | Admitting: Family Medicine

## 2012-12-17 ENCOUNTER — Encounter: Payer: Self-pay | Admitting: Family Medicine

## 2012-12-17 VITALS — BP 137/75 | HR 66 | Temp 99.3°F | Ht 74.0 in | Wt 248.0 lb

## 2012-12-17 DIAGNOSIS — R197 Diarrhea, unspecified: Secondary | ICD-10-CM

## 2012-12-17 DIAGNOSIS — R079 Chest pain, unspecified: Secondary | ICD-10-CM

## 2012-12-17 DIAGNOSIS — R5381 Other malaise: Secondary | ICD-10-CM

## 2012-12-17 DIAGNOSIS — I10 Essential (primary) hypertension: Secondary | ICD-10-CM

## 2012-12-17 DIAGNOSIS — E785 Hyperlipidemia, unspecified: Secondary | ICD-10-CM

## 2012-12-17 DIAGNOSIS — I2510 Atherosclerotic heart disease of native coronary artery without angina pectoris: Secondary | ICD-10-CM

## 2012-12-17 DIAGNOSIS — E119 Type 2 diabetes mellitus without complications: Secondary | ICD-10-CM

## 2012-12-17 DIAGNOSIS — R0602 Shortness of breath: Secondary | ICD-10-CM

## 2012-12-17 LAB — POCT UA - MICROSCOPIC ONLY
Crystals, Ur, HPF, POC: NEGATIVE
Yeast, UA: NEGATIVE

## 2012-12-17 LAB — POCT CBC
Granulocyte percent: 68.6 %G (ref 37–80)
HCT, POC: 38.8 % — AB (ref 43.5–53.7)
Hemoglobin: 14.3 g/dL (ref 14.1–18.1)
POC Granulocyte: 2.7 (ref 2–6.9)

## 2012-12-17 LAB — POCT URINALYSIS DIPSTICK
Ketones, UA: NEGATIVE
Leukocytes, UA: NEGATIVE
pH, UA: 5

## 2012-12-17 LAB — GLUCOSE, POCT (MANUAL RESULT ENTRY)

## 2012-12-17 NOTE — Progress Notes (Signed)
Subjective:    Patient ID: Micheal Figueroa, male    DOB: 06-04-1936, 77 y.o.   MRN: [id number]  HPI Patient comes in today because of complaints of chest pain shortness of breath. He also has had loose bowel movements for a couple weeks. Has had no in the past 3 or 4 weeks he was at because and had a syncopal episode and was diagnosed with dehydration   Review of Systems  Constitutional: Positive for activity change and fatigue.  Eyes: Negative.   Respiratory: Positive for chest tightness.   Cardiovascular: Negative.   Gastrointestinal: Positive for diarrhea.  Endocrine: Negative.   Genitourinary: Negative.   Musculoskeletal: Negative.   Skin: Negative.   Allergic/Immunologic: Negative.   Neurological: Positive for weakness and headaches.  Hematological: Negative.   Psychiatric/Behavioral: Negative.        Objective:   Physical Exam  Nursing note and vitals reviewed. Constitutional: He is oriented to person, place, and time. He appears well-developed and well-nourished. No distress.  HENT:  Head: Normocephalic and atraumatic.  Mouth/Throat: Oropharynx is clear and moist. No oropharyngeal exudate.  Eyes: Conjunctivae are normal. Right eye exhibits no discharge. Left eye exhibits no discharge.  Neck: Normal range of motion. Neck supple. No thyromegaly present.  Cardiovascular: Normal rate and regular rhythm.  Exam reveals no gallop and no friction rub.   No murmur heard. At 60 per minute  Pulmonary/Chest: Effort normal and breath sounds normal. No respiratory distress. He has no wheezes. He has no rales.  Abdominal: Soft. Bowel sounds are normal. He exhibits no distension and no mass. There is no tenderness. There is no rebound and no guarding.  Musculoskeletal: He exhibits no edema and no tenderness.  Lymphadenopathy:    He has no cervical adenopathy.  Neurological: He is alert and oriented to person, place, and time. He has normal reflexes.  Skin: Skin is warm and dry. No  rash noted. No erythema.  Psychiatric: He has a normal mood and affect. His behavior is normal. Judgment and thought content normal.    EKG:"unchanged from previous tracings", rate was 62       Assessment & Plan:  1. Chest pain - EKG 12-Lead - POCT CBC - POCT glucose (manual entry) - COMPLETE METABOLIC PANEL WITH GFR  2. Other malaise and fatigue - POCT CBC - POCT glucose (manual entry) - COMPLETE METABOLIC PANEL WITH GFR - POCT glycosylated hemoglobin (Hb A1C) - POCT UA - Microscopic Only - POCT urinalysis dipstick  3. CAD, UNSPECIFIED SITE -EKG  4. DIABETES MELLITUS -Lab  5. HYPERLIPIDEMIA-MIXED  6. HYPERTENSION, UNSPECIFIED  7. Shortness of breath  8. Diarrhea  Patient Instructions  Clear Liquids for first 24 hrs, Full liquids for second 24 hrs, bland diet (B.R.A.T) for third 24 hours. Avoid caffeine Avoid milk cheese ice cream and dairy Take Tylenol if needed for aches pains or fever       B.R.A.T. Diet Your doctor has recommended the B.R.A.T. diet for you or your child until the condition improves. This is often used to help control diarrhea and vomiting symptoms. If you or your child can tolerate clear liquids, you may have:  Bananas.   Rice.   Applesauce.   Toast (and other simple starches such as crackers, potatoes, noodles).  Be sure to avoid dairy products, meats, and fatty foods until symptoms are better. Fruit juices such as apple, grape, and prune juice can make diarrhea worse. Avoid these. Continue this diet for 2 days or as instructed by  your caregiver. Document Released: 06/11/2005 Document Revised: 05/31/2011 Document Reviewed: 11/28/2006 [facility] Patient Information 2012 [place], [place].

## 2012-12-17 NOTE — Patient Instructions (Addendum)
Clear Liquids for first 24 hrs, Full liquids for second 24 hrs, bland diet (B.R.A.T) for third 24 hours. Avoid caffeine Avoid milk cheese ice cream and dairy Take Tylenol if needed for aches pains or fever       B.R.A.T. Diet Your doctor has recommended the B.R.A.T. diet for you or your child until the condition improves. This is often used to help control diarrhea and vomiting symptoms. If you or your child can tolerate clear liquids, you may have:  Bananas.   Rice.   Applesauce.   Toast (and other simple starches such as crackers, potatoes, noodles).  Be sure to avoid dairy products, meats, and fatty foods until symptoms are better. Fruit juices such as apple, grape, and prune juice can make diarrhea worse. Avoid these. Continue this diet for 2 days or as instructed by your caregiver. Document Released: 06/11/2005 Document Revised: 05/31/2011 Document Reviewed: 11/28/2006 [facility] Patient Information 2012 [place], [place].

## 2012-12-17 NOTE — Telephone Encounter (Signed)
Pt aware has appt this evening

## 2012-12-18 ENCOUNTER — Other Ambulatory Visit: Payer: Self-pay

## 2012-12-18 LAB — COMPLETE METABOLIC PANEL WITH GFR
ALT: 20 U/L (ref 0–53)
Albumin: 4.1 g/dL (ref 3.5–5.2)
CO2: 24 mEq/L (ref 19–32)
Calcium: 9.4 mg/dL (ref 8.4–10.5)
Chloride: 100 mEq/L (ref 96–112)
GFR, Est African American: 47 mL/min — ABNORMAL LOW
Potassium: 4.9 mEq/L (ref 3.5–5.3)
Sodium: 135 mEq/L (ref 135–145)
Total Protein: 6.7 g/dL (ref 6.0–8.3)

## 2012-12-18 MED ORDER — CARVEDILOL 12.5 MG PO TABS: 12.5000 mg | ORAL_TABLET | Freq: Two times a day (BID) | ORAL | Status: DC

## 2012-12-18 MED ORDER — SIMVASTATIN 20 MG PO TABS: 20.0000 mg | ORAL_TABLET | Freq: Every evening | ORAL | Status: DC

## 2012-12-18 MED ORDER — SERTRALINE HCL 50 MG PO TABS: 50.0000 mg | ORAL_TABLET | Freq: Every day | ORAL | Status: DC

## 2012-12-18 MED ORDER — QUINAPRIL HCL 20 MG PO TABS: 20.0000 mg | ORAL_TABLET | Freq: Every day | ORAL | Status: DC

## 2012-12-18 MED ORDER — PANTOPRAZOLE SODIUM 40 MG PO TBEC: 40.0000 mg | DELAYED_RELEASE_TABLET | Freq: Every day | ORAL | Status: DC

## 2013-02-19 ENCOUNTER — Other Ambulatory Visit: Payer: Self-pay

## 2013-02-19 MED ORDER — SIMVASTATIN 20 MG PO TABS: 20.0000 mg | ORAL_TABLET | Freq: Every evening | ORAL | Status: DC

## 2013-02-19 NOTE — Telephone Encounter (Signed)
No lipids since EPIC   If approved print for mail order and route to nurse

## 2013-03-10 ENCOUNTER — Encounter: Payer: Self-pay | Admitting: Family Medicine

## 2013-03-10 ENCOUNTER — Ambulatory Visit (INDEPENDENT_AMBULATORY_CARE_PROVIDER_SITE_OTHER): Payer: Medicare Other | Admitting: Family Medicine

## 2013-03-10 VITALS — BP 124/63 | HR 59 | Temp 98.1°F | Ht 74.0 in | Wt 242.0 lb

## 2013-03-10 DIAGNOSIS — R5381 Other malaise: Secondary | ICD-10-CM

## 2013-03-10 DIAGNOSIS — I2510 Atherosclerotic heart disease of native coronary artery without angina pectoris: Secondary | ICD-10-CM

## 2013-03-10 DIAGNOSIS — R194 Change in bowel habit: Secondary | ICD-10-CM

## 2013-03-10 DIAGNOSIS — I10 Essential (primary) hypertension: Secondary | ICD-10-CM

## 2013-03-10 DIAGNOSIS — R198 Other specified symptoms and signs involving the digestive system and abdomen: Secondary | ICD-10-CM

## 2013-03-10 DIAGNOSIS — R531 Weakness: Secondary | ICD-10-CM

## 2013-03-10 DIAGNOSIS — R350 Frequency of micturition: Secondary | ICD-10-CM

## 2013-03-10 DIAGNOSIS — E039 Hypothyroidism, unspecified: Secondary | ICD-10-CM

## 2013-03-10 LAB — POCT URINALYSIS DIPSTICK
Blood, UA: NEGATIVE
Glucose, UA: NEGATIVE
Leukocytes, UA: NEGATIVE
Nitrite, UA: NEGATIVE
Urobilinogen, UA: NEGATIVE
pH, UA: 5

## 2013-03-10 LAB — POCT UA - MICROSCOPIC ONLY
Casts, Ur, LPF, POC: NEGATIVE
Crystals, Ur, HPF, POC: NEGATIVE

## 2013-03-10 LAB — POCT CBC
HCT, POC: 40.8 % — AB (ref 43.5–53.7)
Hemoglobin: 13.5 g/dL — AB (ref 14.1–18.1)
MCHC: 33.1 g/dL (ref 31.8–35.4)
MCV: 92.4 fL (ref 80–97)
RDW, POC: 14.1 %
WBC: 6.1 10*3/uL (ref 4.6–10.2)

## 2013-03-10 NOTE — Patient Instructions (Signed)
Continue to monitor blood pressure closely at home, also monitor her pulse rate Check with cardiologist regarding correct dose of Coreg Please be careful and don't put yourself at risk for falling Remember to get flu shot in October

## 2013-03-10 NOTE — Progress Notes (Signed)
  Subjective:    Patient ID: Micheal Figueroa, male    DOB: 1935/11/26, 77 y.o.   MRN: [id number]  HPI Patient returns to clinic today for followup and management of chronic medical problems. He complains of fatigue edema increased frequency of loose stools and arthralgias. He has a history of coronary artery disease, diabetes mellitus, hyperlipidemia and obesity. He also has a history of neuropathy and GI bleeds in the past. He is accompanied today by his wife.    Review of Systems  Constitutional: Positive for fatigue.  Eyes: Negative.   Respiratory: Negative.   Cardiovascular: Positive for leg swelling (ankle swelling).  Gastrointestinal: Positive for diarrhea (more than normal).  Endocrine: Negative.   Genitourinary: Positive for frequency.  Musculoskeletal: Positive for arthralgias.  Skin: Negative.   Allergic/Immunologic: Negative.   Neurological: Positive for weakness and headaches.  Hematological: Negative.   Psychiatric/Behavioral: Negative.        Objective:   Physical Exam  BP 124/63  Pulse 59  Temp(Src) 98.1 F (36.7 C) (Oral)  Ht 6\' 2"  (1.88 m)  Wt 242 lb (109.77 kg)  BMI 31.06 kg/m2  The patient appeared well nourished and normally developed for his age and comorbidities, alert and oriented to time and place. Speech, behavior and judgement appear normal. Vital signs as documented.  Head exam is unremarkable. No scleral icterus or pallor noted. Ears nose throat and mouth were normal.  Neck is without jugular venous distension, thyromegally, or carotid bruits. Carotid upstrokes are brisk bilaterally. No cervical adenopathy. Lungs are clear anteriorly and posteriorly to auscultation. Normal respiratory effort. Cardiac exam reveals regular rate and rhythm at 72 per minute. First and second heart sounds normal.  No murmurs, rubs or gallops.  Abdominal exam reveals normal bowl sounds, no masses, no organomegaly and no aortic enlargement. No inguinal adenopathy. There  is no abdominal tenderness Extremities are nonedematous and both femoral  pulses are normal. Musculoskeletal, patient was somewhat stiff and slow to move due to his back pain. Skin without pallor or jaundice.  Warm and dry, without rash. Neurologic exam reveals normal deep tendon reflexes and normal sensation.         Assessment & Plan:  1. HYPERTENSION, UNSPECIFIED - POCT CBC - BMP8+EGFR - Hepatic function panel - Thyroid Panel With TSH  2. CAD, UNSPECIFIED SITE - POCT CBC - BMP8+EGFR - Hepatic function panel - Thyroid Panel With TSH  3. Unspecified hypothyroidism - Thyroid Panel With TSH  4. Urine frequency - POCT urinalysis dipstick - POCT UA - Microscopic Only  5. Weakness  6. Frequent bowel movements  Patient Instructions  Continue to monitor blood pressure closely at home, also monitor her pulse rate Check with cardiologist regarding correct dose of Coreg Please be careful and don't put yourself at risk for falling Remember to get flu shot in October    Please bring blood pressure monitor  for confirmation of its accuracy to the office We will send copies of lab work to physicians in [facility]   Nyra Capes MD

## 2013-03-11 LAB — BMP8+EGFR
BUN: 28 mg/dL — ABNORMAL HIGH (ref 8–27)
CO2: 24 mmol/L (ref 18–29)
Calcium: 9.4 mg/dL (ref 8.6–10.2)
Chloride: 101 mmol/L (ref 97–108)
Creatinine, Ser: 1.23 mg/dL (ref 0.76–1.27)
Glucose: 156 mg/dL — ABNORMAL HIGH (ref 65–99)

## 2013-03-11 LAB — THYROID PANEL WITH TSH
Free Thyroxine Index: 2.3 (ref 1.2–4.9)
T3 Uptake Ratio: 31 % (ref 24–39)
TSH: 1.25 u[IU]/mL (ref 0.450–4.500)

## 2013-03-11 LAB — HEPATIC FUNCTION PANEL
AST: 17 IU/L (ref 0–40)
Alkaline Phosphatase: 88 IU/L (ref 39–117)
Bilirubin, Direct: 0.11 mg/dL (ref 0.00–0.40)

## 2013-03-12 ENCOUNTER — Telehealth: Payer: Self-pay | Admitting: Family Medicine

## 2013-03-26 ENCOUNTER — Encounter: Payer: Self-pay | Admitting: *Deleted

## 2013-03-26 NOTE — Progress Notes (Signed)
Patient ID: Micheal Figueroa, male   DOB: 10-17-1935, 77 y.o.   MRN: [id number] Medication list was updated today.

## 2013-03-31 ENCOUNTER — Other Ambulatory Visit: Payer: Self-pay | Admitting: *Deleted

## 2013-03-31 MED ORDER — FLORANEX PO TABS: ORAL_TABLET | ORAL | Status: DC

## 2013-04-13 ENCOUNTER — Other Ambulatory Visit: Payer: Self-pay

## 2013-04-13 MED ORDER — GLUCOSE BLOOD VI STRP: ORAL_STRIP | Status: DC

## 2013-04-15 ENCOUNTER — Other Ambulatory Visit: Payer: Self-pay

## 2013-04-15 MED ORDER — GLUCOSE BLOOD VI STRP: ORAL_STRIP | Status: AC

## 2013-04-29 ENCOUNTER — Other Ambulatory Visit: Payer: Self-pay

## 2013-04-29 MED ORDER — SERTRALINE HCL 50 MG PO TABS: 50.0000 mg | ORAL_TABLET | Freq: Every day | ORAL | Status: DC

## 2013-04-29 NOTE — Telephone Encounter (Signed)
Last seen 03/10/13  DWM If approved print for mail order and route to nurse

## 2013-05-06 ENCOUNTER — Telehealth: Payer: Self-pay | Admitting: *Deleted

## 2013-05-06 ENCOUNTER — Telehealth: Payer: Self-pay | Admitting: Family Medicine

## 2013-05-06 NOTE — Telephone Encounter (Signed)
Wants nurse to call about meds.

## 2013-05-07 ENCOUNTER — Other Ambulatory Visit: Payer: Self-pay | Admitting: *Deleted

## 2013-05-07 MED ORDER — SERTRALINE HCL 50 MG PO TABS: 50.0000 mg | ORAL_TABLET | Freq: Every day | ORAL | Status: DC

## 2013-05-07 NOTE — Telephone Encounter (Signed)
Med sent elect. And faxed for pt

## 2013-05-26 ENCOUNTER — Other Ambulatory Visit: Payer: Self-pay

## 2013-05-26 MED ORDER — PANTOPRAZOLE SODIUM 40 MG PO TBEC: 40.0000 mg | DELAYED_RELEASE_TABLET | Freq: Every day | ORAL | Status: DC

## 2013-05-26 MED ORDER — SIMVASTATIN 20 MG PO TABS: 20.0000 mg | ORAL_TABLET | Freq: Every evening | ORAL | Status: DC

## 2013-05-26 NOTE — Telephone Encounter (Signed)
Last seen 03/10/13 DWM  Last lipid 04/01/12   If approved print for mail order and route to  nurse

## 2013-06-09 ENCOUNTER — Other Ambulatory Visit: Payer: Self-pay | Admitting: *Deleted

## 2013-06-09 MED ORDER — PANTOPRAZOLE SODIUM 40 MG PO TBEC: 40.0000 mg | DELAYED_RELEASE_TABLET | Freq: Every day | ORAL | Status: AC

## 2013-06-09 MED ORDER — GABAPENTIN 300 MG PO CAPS: 900.0000 mg | ORAL_CAPSULE | Freq: Three times a day (TID) | ORAL | Status: AC

## 2013-06-09 MED ORDER — FLORANEX PO TABS: ORAL_TABLET | ORAL | Status: AC

## 2013-06-09 MED ORDER — CLOPIDOGREL BISULFATE 75 MG PO TABS: 75.0000 mg | ORAL_TABLET | Freq: Every day | ORAL | Status: AC

## 2013-06-09 MED ORDER — CARVEDILOL 25 MG PO TABS: 25.0000 mg | ORAL_TABLET | Freq: Two times a day (BID) | ORAL | Status: AC

## 2013-06-09 MED ORDER — TAMSULOSIN HCL 0.4 MG PO CAPS: 0.4000 mg | ORAL_CAPSULE | Freq: Every day | ORAL | Status: AC

## 2013-06-09 MED ORDER — QUINAPRIL HCL 20 MG PO TABS: 20.0000 mg | ORAL_TABLET | Freq: Every day | ORAL | Status: DC

## 2013-06-09 MED ORDER — DIGOXIN 125 MCG PO TABS: 0.1250 mg | ORAL_TABLET | Freq: Every day | ORAL | Status: AC

## 2013-06-09 MED ORDER — FINASTERIDE 5 MG PO TABS: 5.0000 mg | ORAL_TABLET | Freq: Every day | ORAL | Status: AC

## 2013-06-09 MED ORDER — SERTRALINE HCL 50 MG PO TABS: 50.0000 mg | ORAL_TABLET | Freq: Every day | ORAL | Status: AC

## 2013-06-09 MED ORDER — SIMVASTATIN 20 MG PO TABS: 20.0000 mg | ORAL_TABLET | Freq: Every evening | ORAL | Status: AC

## 2013-06-09 NOTE — Telephone Encounter (Signed)
Pt is moving to [place] and needs written rx's to take until he finds a new MD.

## 2014-03-12 ENCOUNTER — Other Ambulatory Visit: Payer: Self-pay | Admitting: *Deleted

## 2014-03-12 MED ORDER — QUINAPRIL HCL 20 MG PO TABS: 20.0000 mg | ORAL_TABLET | Freq: Every day | ORAL | Status: AC

## 2014-03-12 NOTE — Telephone Encounter (Signed)
Patient last seen in office on 03-10-13. Received fax from pharmacy in [place]. Please advise on refill.

## 2014-03-12 NOTE — Telephone Encounter (Signed)
This will be okay to refill x1.
